# Patient Record
Sex: Female | Born: 2000 | Race: White | Hispanic: No | Marital: Single
Health system: Southern US, Community
[De-identification: ages and names within clinical notes are randomized; demographics above are authoritative.]

---
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
## 2024-06-15 ENCOUNTER — Other Ambulatory Visit: Payer: Self-pay | Admitting: Primary Care

## 2024-06-15 DIAGNOSIS — Z3201 Encounter for pregnancy test, result positive: Secondary | ICD-10-CM

## 2024-06-23 ENCOUNTER — Ambulatory Visit
Admission: RE | Admit: 2024-06-23 | Discharge: 2024-06-23 | Disposition: A | Discharge status: Home / Self Care | Payer: Self-pay | Source: Ambulatory Visit | Attending: Primary Care | Admitting: Primary Care

## 2024-06-23 ENCOUNTER — Other Ambulatory Visit: Payer: Self-pay | Admitting: Primary Care

## 2024-06-23 DIAGNOSIS — Z3201 Encounter for pregnancy test, result positive: Secondary | ICD-10-CM
